# Patient Record
Sex: Male | Born: 1955 | Race: White | Hispanic: No | Marital: Married | Smoking: Current every day smoker
Health system: Southern US, Community
[De-identification: ages and names within clinical notes are randomized; demographics above are authoritative.]

---

## 2016-07-13 DIAGNOSIS — R42 Dizziness and giddiness: Secondary | ICD-10-CM | POA: Diagnosis not present

## 2018-06-01 ENCOUNTER — Other Ambulatory Visit: Payer: Self-pay

## 2018-06-01 ENCOUNTER — Emergency Department (HOSPITAL_COMMUNITY)
Admission: EM | Admit: 2018-06-01 | Discharge: 2018-06-02 | Disposition: A | Discharge status: Home / Self Care | Payer: PPO | Attending: Emergency Medicine | Admitting: Emergency Medicine

## 2018-06-01 DIAGNOSIS — R002 Palpitations: Secondary | ICD-10-CM | POA: Insufficient documentation

## 2018-06-01 NOTE — ED Triage Notes (Signed)
Patient c/o palpitations earlier. Took (3) 81mg  asa PTA. NAD noted at this time

## 2018-06-02 ENCOUNTER — Encounter (HOSPITAL_COMMUNITY): Payer: Self-pay | Admitting: Emergency Medicine

## 2018-06-02 ENCOUNTER — Emergency Department (HOSPITAL_COMMUNITY): Payer: PPO

## 2018-06-02 LAB — BASIC METABOLIC PANEL
Anion gap: 13 (ref 5–15)
BUN: 11 mg/dL (ref 8–23)
CO2: 20 mmol/L — ABNORMAL LOW (ref 22–32)
Calcium: 8.8 mg/dL — ABNORMAL LOW (ref 8.9–10.3)
Chloride: 109 mmol/L (ref 98–111)
Creatinine, Ser: 0.98 mg/dL (ref 0.61–1.24)
GFR calc Af Amer: >60 mL/min (ref >60)
GFR calc non Af Amer: >60 mL/min (ref >60)
Glucose, Bld: 177 mg/dL — ABNORMAL HIGH (ref 70–99)
Potassium: 3.8 mmol/L (ref 3.5–5.1)
Sodium: 142 mmol/L (ref 135–145)

## 2018-06-02 LAB — CBC
HCT: 43 % (ref 39.0–52.0)
Hemoglobin: 14.1 g/dL (ref 13.0–17.0)
MCH: 29.3 pg (ref 26.0–34.0)
MCHC: 32.8 g/dL (ref 30.0–36.0)
MCV: 89.2 fL (ref 80.0–100.0)
Platelets: 271 10*3/uL (ref 150–400)
RBC: 4.82 MIL/uL (ref 4.22–5.81)
RDW: 12.1 % (ref 11.5–15.5)
WBC: 9.8 10*3/uL (ref 4.0–10.5)
nRBC: 0 % (ref 0.0–0.2)

## 2018-06-02 LAB — I-STAT TROPONIN, ED: Troponin i, poc: 0.01 ng/mL (ref 0.00–0.08)

## 2018-06-02 MED ORDER — SODIUM CHLORIDE 0.9% FLUSH: 3.0000 mL | Freq: Once | INTRAVENOUS | Status: DC

## 2018-06-02 NOTE — ED Provider Notes (Signed)
[facility] EMERGENCY DEPARTMENT Provider Note   CSN: [id number] Arrival date & time: 06/01/18  2346     History   Chief Complaint Chief Complaint  Patient presents with  . Palpitations    HPI Anthony Bush is a 63 y.o. male.  Patient presents to the emergency department with a chief complaint of heart palpitations.  States that he had an episode of heart palpitations this evening that lasted for about 15 or 20 minutes.  States that it felt like his heart was racing.  He denies having had any chest pain, shortness of breath, dizziness, or lightheadedness.  States that he has never experienced this before.  States that he had a Coca[employer]a, but states that he routinely drinks tea and several cups of coffee per day, has not had any new or additional stimulants.  Denies having any symptoms at this time.  States that by the time EMS arrived, his symptoms had resolved.  The history is provided by the patient. No language interpreter was used.    No past medical history on file.  There are no active problems to display for this patient.   History reviewed. No pertinent surgical history.      Home Medications    Prior to Admission medications   Not on File    Family History No family history on file.  Social History Social History   Tobacco Use  . Smoking status: Not on file  Substance Use Topics  . Alcohol use: Not on file  . Drug use: Not on file     Allergies   Patient has no known allergies.   Review of Systems Review of Systems  All other systems reviewed and are negative.    Physical Exam Updated Vital Signs BP 108/88 (BP Location: Right Arm)   Pulse (!) 103   Temp 98.8 F (37.1 C)   Resp 16   Ht 5\' 10"  (1.778 m)   Wt 74.8 kg   SpO2 96%   BMI 23.68 kg/m   Physical Exam Vitals signs and nursing note reviewed.  Constitutional:      Appearance: He is well-developed.  HENT:     Head: Normocephalic and atraumatic.  Eyes:   General: No scleral icterus.       Right eye: No discharge.        Left eye: No discharge.     Conjunctiva/sclera: Conjunctivae normal.     Pupils: Pupils are equal, round, and reactive to light.  Neck:     Musculoskeletal: Normal range of motion and neck supple.     Vascular: No JVD.  Cardiovascular:     Rate and Rhythm: Normal rate and regular rhythm.     Heart sounds: Normal heart sounds. No murmur. No friction rub. No gallop.   Pulmonary:     Effort: Pulmonary effort is normal. No respiratory distress.     Breath sounds: Normal breath sounds. No wheezing or rales.  Chest:     Chest wall: No tenderness.  Abdominal:     General: There is no distension.     Palpations: Abdomen is soft. There is no mass.     Tenderness: There is no abdominal tenderness. There is no guarding or rebound.  Musculoskeletal: Normal range of motion.        General: No tenderness.  Skin:    General: Skin is warm and dry.  Neurological:     Mental Status: He is alert and oriented to person, place, and  time.  Psychiatric:        Behavior: Behavior normal.        Thought Content: Thought content normal.        Judgment: Judgment normal.      ED Treatments / Results  Labs (all labs ordered are listed, but only abnormal results are displayed) Labs Reviewed  BASIC METABOLIC PANEL - Abnormal; Notable for the following components:      Result Value   CO2 20 (*)    Glucose, Bld 177 (*)    Calcium 8.8 (*)    All other components within normal limits  CBC  I-STAT TROPONIN, ED    EKG EKG Interpretation  Date/Time:  Sunday June 02 2018 02:50:47 EST Ventricular Rate:  58 PR Interval:    QRS Duration: 89 QT Interval:  413 QTC Calculation: 406 R Axis:   84 Text Interpretation:  Sinus rhythm Borderline right axis deviation Baseline wander in lead(s) V2 When compared with ECG of 06/01/2018, HEART RATE has decreased Nonspecific ST abnormality is no longer present Confirmed by Dione Booze ([id number]) on  06/02/2018 4:41:24 AM  Radiology Dg Chest 2 View  Result Date: 06/02/2018 CLINICAL DATA:  Palpitations EXAM: CHEST - 2 VIEW COMPARISON:  None. FINDINGS: Normal heart size. Normal mediastinal contour. No pneumothorax. No pleural effusion. Mildly hyperinflated lungs. No pulmonary edema. No acute consolidative airspace disease. IMPRESSION: Mildly hyperinflated lungs, suggesting obstructive lung disease. Otherwise no active disease in the chest. Electronically Signed   By: Delbert Phenix M.D.   On: 06/02/2018 00:20    Procedures Procedures (including critical care time)  Medications Ordered in ED Medications  sodium chloride flush (NS) 0.9 % injection 3 mL (has no administration in time range)     Initial Impression / Assessment and Plan / ED Course  I have reviewed the triage vital signs and the nursing notes.  Pertinent labs & imaging results that were available during my care of the patient were reviewed by me and considered in my medical decision making (see chart for details).     Patient with heart palpitations.  Laboratory work-up performed in triage is reassuring.  EKG shows no concerning arrhythmias.  Patient is asymptomatic now.  He is well-appearing.  Will plan for discharge to home with cardiology follow-up.  May benefit from wearing a Holter monitor.  Final Clinical Impressions(s) / ED Diagnoses   Final diagnoses:  Palpitations    ED Discharge Orders    None       Roxy Horseman, PA-C 06/02/18 0443    Dione Booze, MD 06/02/18 [phone]

## 2018-06-05 DIAGNOSIS — R002 Palpitations: Secondary | ICD-10-CM | POA: Diagnosis not present

## 2018-07-19 ENCOUNTER — Telehealth: Payer: Self-pay | Admitting: Cardiology

## 2018-07-19 NOTE — Telephone Encounter (Signed)
Called patient and LVM to call back to reschedule his 07-24-2018 appointment with Dr. [place].  He can be seen on his DOD day on 07-25-2018 if opening available or any other DOD day.

## 2018-07-24 ENCOUNTER — Ambulatory Visit: Payer: Self-pay | Admitting: Cardiology

## 2018-10-10 ENCOUNTER — Telehealth: Payer: Self-pay | Admitting: Cardiology

## 2018-10-10 NOTE — Telephone Encounter (Signed)
LVM for patient to let us know if he wants a video or telephone visit with Dr. Martinique.

## 2018-10-15 ENCOUNTER — Telehealth: Payer: Self-pay | Admitting: Cardiology

## 2018-10-15 NOTE — Telephone Encounter (Signed)
LVM for pre reg /has no email & my chart code exp

## 2018-10-17 ENCOUNTER — Telehealth: Payer: Self-pay | Admitting: Cardiology

## 2018-10-17 NOTE — Telephone Encounter (Addendum)
Called x3 for pre reg & LVM/ no email, my chart code exp

## 2018-10-18 NOTE — Telephone Encounter (Signed)
LVM for pt, reminding him of his appt on 10-21-18 with Dr Jordan °

## 2018-10-18 NOTE — Progress Notes (Deleted)
{  Choose 1 Note Type (Telehealth Visit or Telephone Visit):[phone]}   Date:  10/18/2018   ID:  Anthony Bush, DOB 05/15/1955, MRN [id number]  {Patient [phone]::"Home"} {Provider [phone]::"Home"}  PCP:  Heywood Bene, PA-C  Cardiologist:  Shellene Sweigert Martinique MD Electrophysiologist:  None   Evaluation Performed:  New Patient Evaluation  Chief Complaint:  palpitations  History of Present Illness:    Anthony Bush is a 63 y.o. male seen at the request of Anthony Lundborg PA for evaluation of palpitations. The patient was seen initially in the ED in February with palpitations. Initial Ecg showed tachycardia with rate 103 bpm. Somewhat peaked P waves and ST depression in the inferior leads. Repeat Ecg showed sinus brady at 58 bpm and normal P wave morphology.   The patient {does/does not:200015} have symptoms concerning for COVID-19 infection (fever, chills, cough, or new shortness of breath).    No past medical history on file. No past surgical history on file.   No outpatient medications have been marked as taking for the 10/21/18 encounter (Appointment) with Martinique, [name], MD.     Allergies:   Patient has no known allergies.   Social History   Tobacco Use  . Smoking status: Not on file  Substance Use Topics  . Alcohol use: Not on file  . Drug use: Not on file     Family Hx: The patient's family history is not on file.  ROS:   Please see the history of present illness.    *** All other systems reviewed and are negative.   Prior CV studies:   The following studies were reviewed today:  ***  Labs/Other Tests and Data Reviewed:    EKG:  {EKG/Telemetry Strips Reviewed:[phone]}  Recent Labs: 06/02/2018: BUN 11; Creatinine, Ser 0.98; Hemoglobin 14.1; Platelets 271; Potassium 3.8; Sodium 142   Recent Lipid Panel No results found for: CHOL, TRIG, HDL, CHOLHDL, LDLCALC, LDLDIRECT  Wt Readings from Last 3 Encounters:  06/01/18 165 lb  (74.8 kg)     Objective:    Vital Signs:  There were no vitals taken for this visit.   {[facility] Virtual Exam (Optional):[phone]::"VITAL SIGNS:  reviewed"}  ASSESSMENT & PLAN:    1. Palpitations. ? Ectopic atrial tachycardia.   COVID-19 Education: The signs and symptoms of COVID-19 were discussed with the patient and how to seek care for testing (follow up with PCP or arrange E-visit).  ***The importance of social distancing was discussed today.  Time:   Today, I have spent *** minutes with the patient with telehealth technology discussing the above problems.     Medication Adjustments/Labs and Tests Ordered: Current medicines are reviewed at length with the patient today.  Concerns regarding medicines are outlined above.   Tests Ordered: No orders of the defined types were placed in this encounter.   Medication Changes: No orders of the defined types were placed in this encounter.   Follow Up:  {F/U Format:[phone]} {follow up:15908}  Signed, Kierria Feigenbaum Martinique, MD  10/18/2018 8:24 AM    [name]

## 2018-10-21 ENCOUNTER — Telehealth: Payer: Self-pay

## 2018-10-21 ENCOUNTER — Telehealth: Payer: PPO | Admitting: Cardiology

## 2018-10-21 NOTE — Telephone Encounter (Signed)
Attempted to reach pt 3x in regards to virtual visit with Dr. Martinique scheduled for today but no answer.

## 2019-03-07 DIAGNOSIS — Z20828 Contact with and (suspected) exposure to other viral communicable diseases: Secondary | ICD-10-CM | POA: Diagnosis not present

## 2019-03-07 DIAGNOSIS — J029 Acute pharyngitis, unspecified: Secondary | ICD-10-CM | POA: Diagnosis not present

## 2019-03-07 DIAGNOSIS — J069 Acute upper respiratory infection, unspecified: Secondary | ICD-10-CM | POA: Diagnosis not present

## 2019-08-05 IMAGING — CR DG CHEST 2V
2 series · 2 of 2 positions shown · non-contrast
Comparison: None.

CLINICAL DATA: Palpitations

EXAM:
CHEST - 2 VIEW

[chest pa]
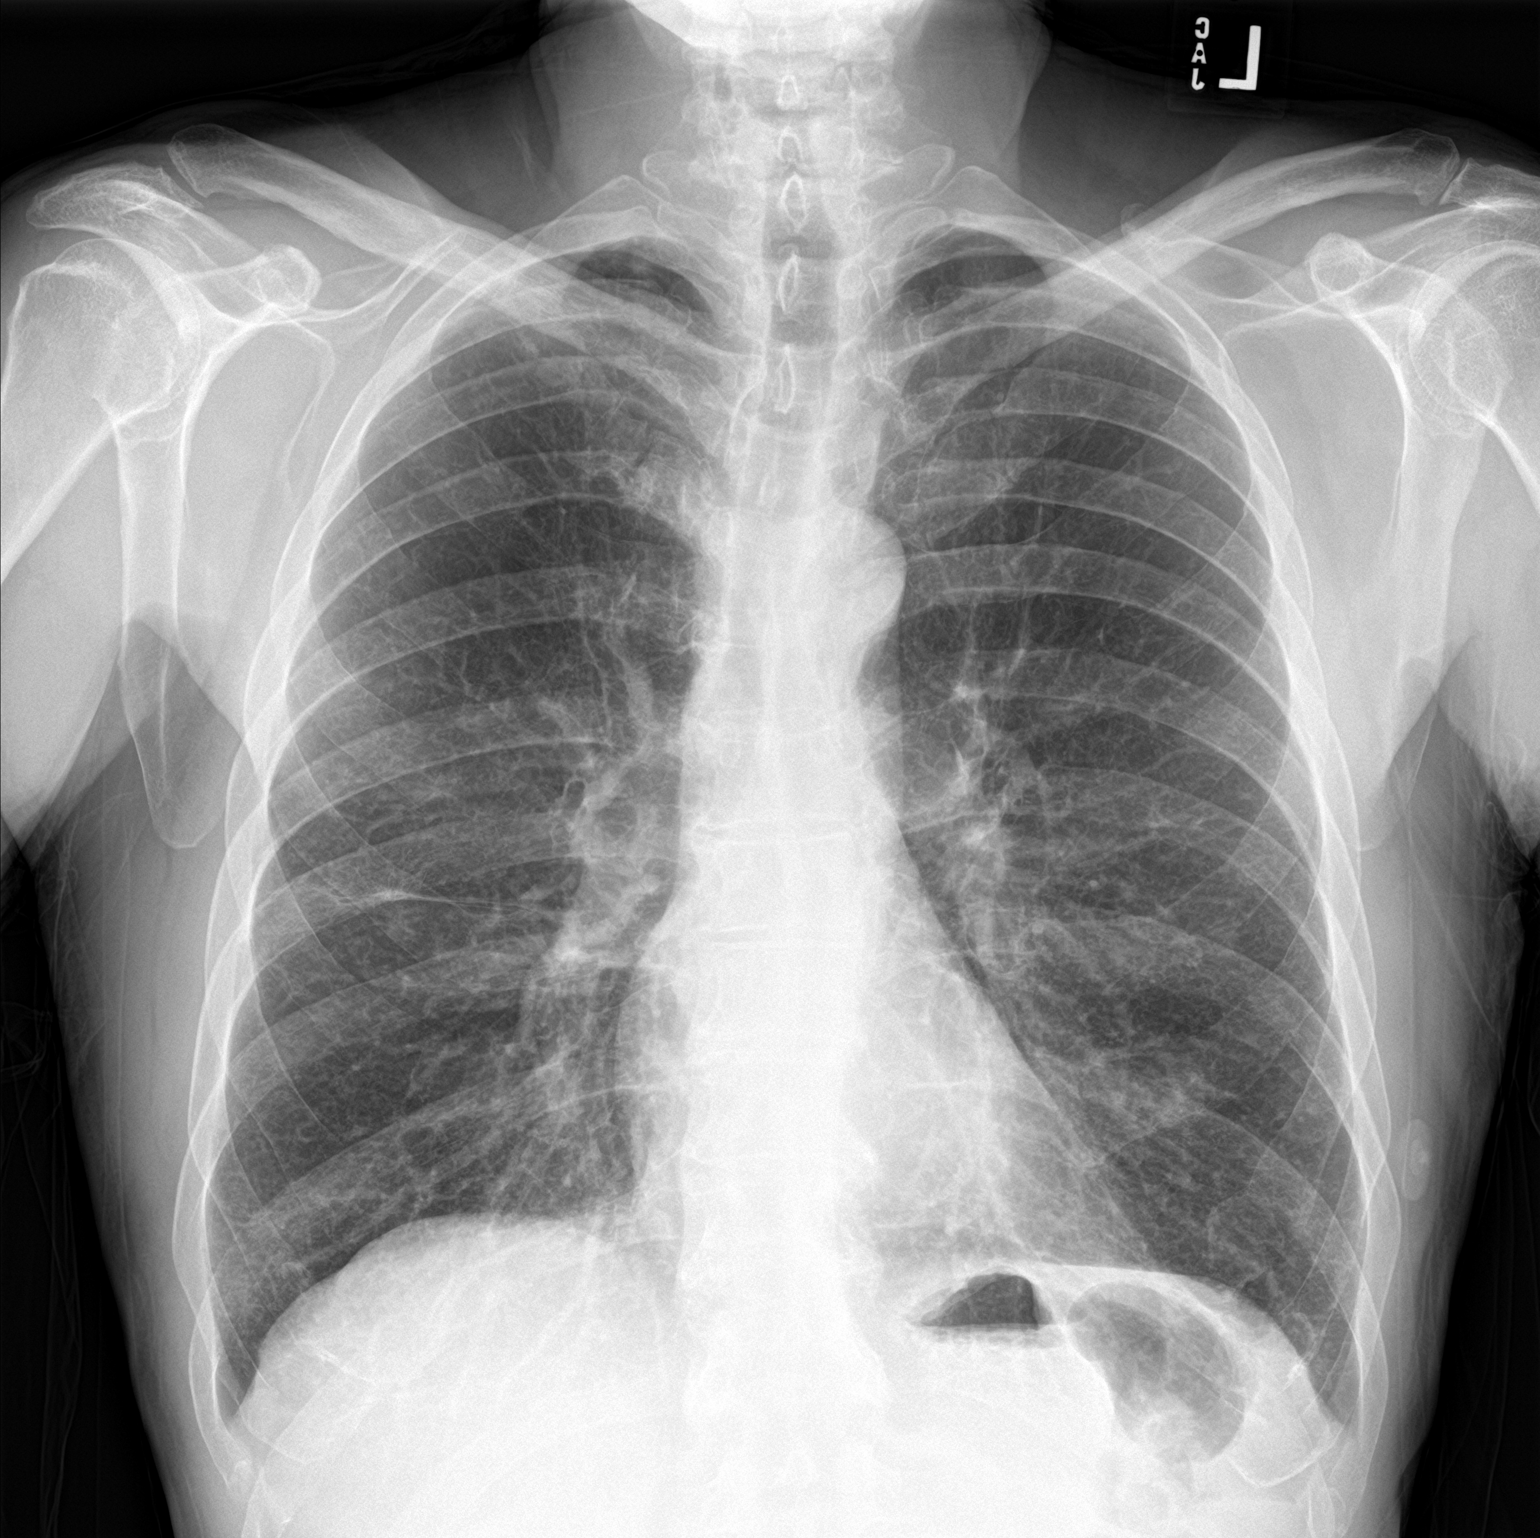

[chest lat]
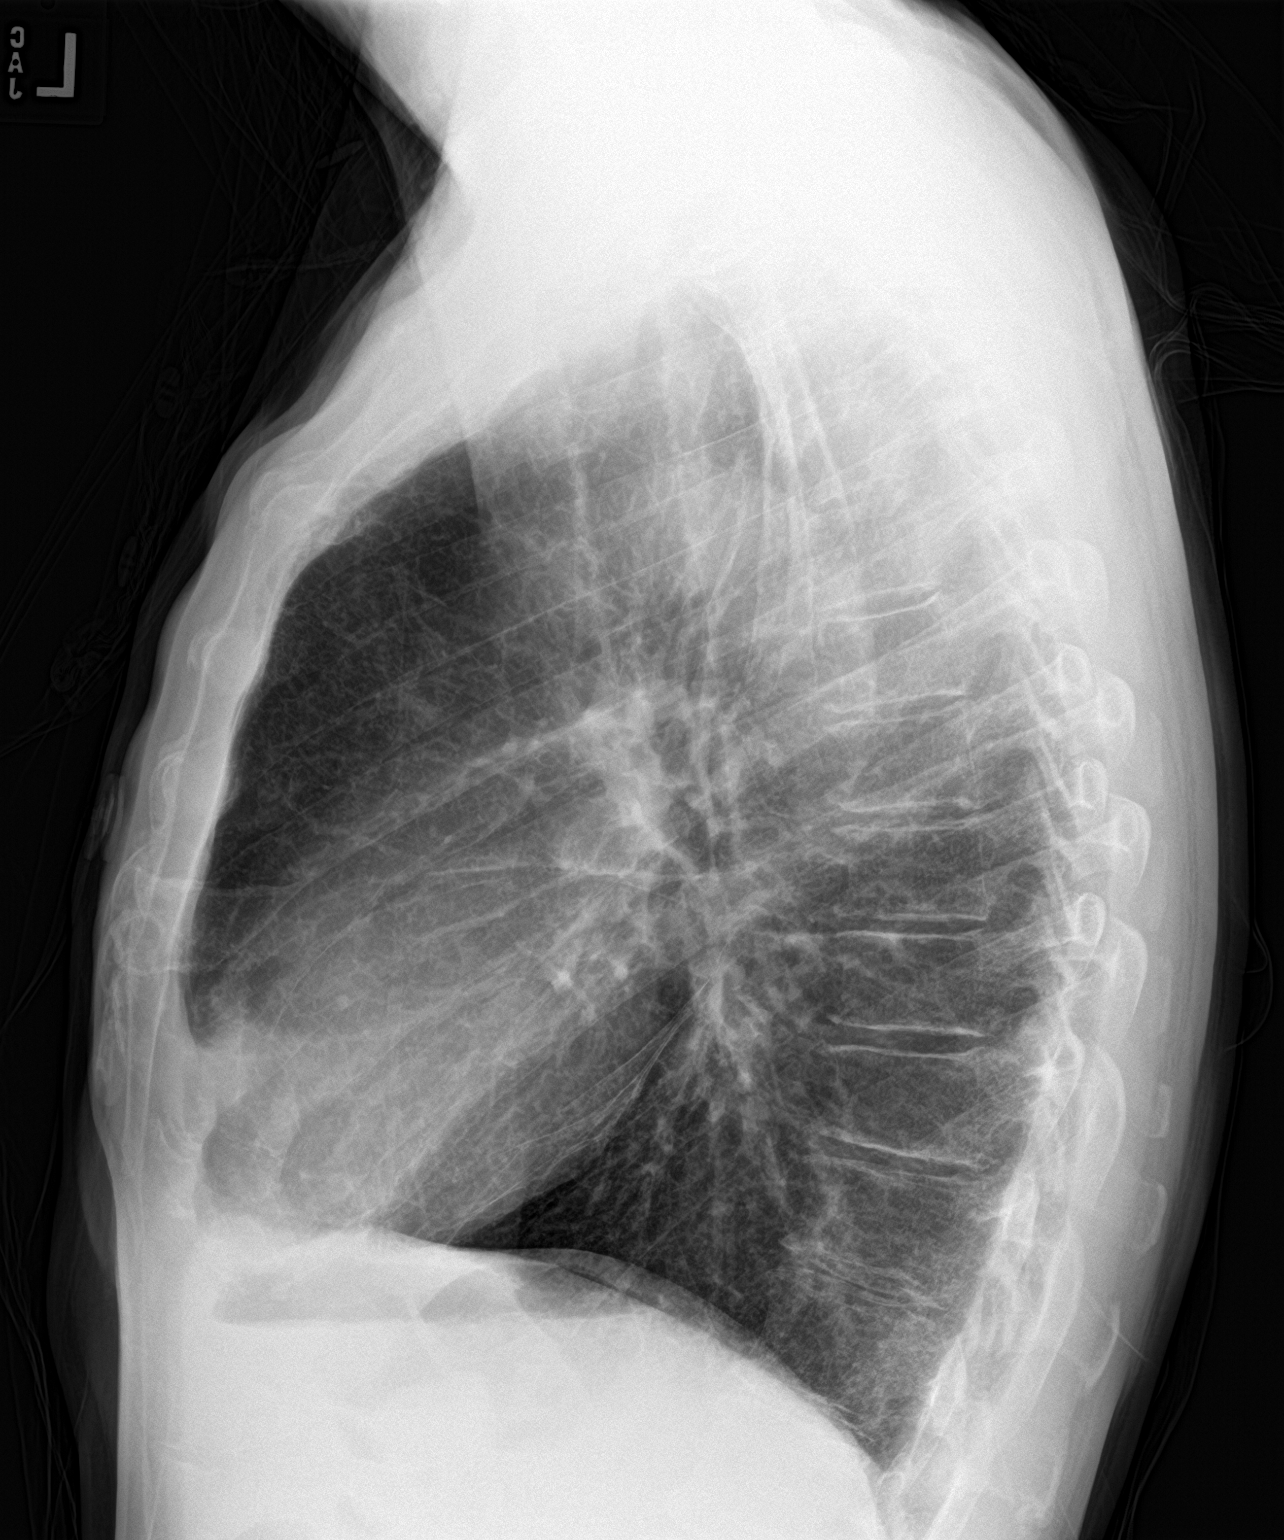

[2 of 2 positions shown; findings below may reference images not displayed]

FINDINGS: Normal heart size. Normal mediastinal contour. No pneumothorax. No
pleural effusion. Mildly hyperinflated lungs. No pulmonary edema. No
acute consolidative airspace disease.
IMPRESSION: Mildly hyperinflated lungs, suggesting obstructive lung disease.
Otherwise no active disease in the chest.

## 2020-02-26 ENCOUNTER — Telehealth (HOSPITAL_COMMUNITY): Payer: Self-pay

## 2020-02-26 ENCOUNTER — Telehealth: Payer: Self-pay | Admitting: Nurse Practitioner

## 2020-02-26 NOTE — Telephone Encounter (Signed)
Pt called to discuss transportation for his wife for remdesivir outpatient appointments. After scheduling the patient's wife's appointments, pt stated he also needed treatment for COVID.   RN discussed with patient about Covid symptoms and the use of the monoclonal antibody infusion for those with mild to moderate Covid symptoms and at a high risk of hospitalization.     Pt appears to qualify for this infusion due to co-morbid conditions and/or a member of an at-risk group in accordance with the FDA Emergency Use Authorization.    Pt stated that his symptoms started on 10/30 and did a home abbot test on 11/3 that was positive. Pt states he is having cough, congestion, body aches, headache, and back pain. Pt qualifies for treatment based on age. Pt informed that an APP will be calling to confirm details with patient.

## 2020-02-26 NOTE — Telephone Encounter (Signed)
Called patient to discuss Covid symptoms and the use of casirivimab/imdevimab, a monoclonal antibody infusion for those with mild to moderate Covid symptoms and at a high risk of hospitalization.  Pt is qualified for this infusion at the [facility] due to; Specific high risk criteria : Older age (>/= 65 yo)   Message left to call back our hotline [phone].  Tarahji Ramthun, NP   

## 2020-02-27 ENCOUNTER — Ambulatory Visit (HOSPITAL_COMMUNITY)
Admission: RE | Admit: 2020-02-27 | Discharge: 2020-02-27 | Disposition: A | Discharge status: Home / Self Care | Payer: PPO | Source: Ambulatory Visit | Attending: Pulmonary Disease | Admitting: Pulmonary Disease

## 2020-02-27 ENCOUNTER — Other Ambulatory Visit: Payer: Self-pay | Admitting: Infectious Diseases

## 2020-02-27 DIAGNOSIS — U071 COVID-19: Secondary | ICD-10-CM

## 2020-02-27 DIAGNOSIS — Z23 Encounter for immunization: Secondary | ICD-10-CM | POA: Diagnosis not present

## 2020-02-27 MED ORDER — FAMOTIDINE IN NACL 20-0.9 MG/50ML-% IV SOLN: 20.0000 mg | Freq: Once | INTRAVENOUS | Status: DC | PRN

## 2020-02-27 MED ORDER — ALBUTEROL SULFATE HFA 108 (90 BASE) MCG/ACT IN AERS: 2.0000 | INHALATION_SPRAY | Freq: Once | RESPIRATORY_TRACT | Status: DC | PRN

## 2020-02-27 MED ORDER — SODIUM CHLORIDE 0.9 % IV SOLN: INTRAVENOUS | Status: DC | PRN

## 2020-02-27 MED ORDER — EPINEPHRINE 0.3 MG/0.3ML IJ SOAJ: 0.3000 mg | Freq: Once | INTRAMUSCULAR | Status: DC | PRN

## 2020-02-27 MED ORDER — DIPHENHYDRAMINE HCL 50 MG/ML IJ SOLN: 50.0000 mg | Freq: Once | INTRAMUSCULAR | Status: DC | PRN

## 2020-02-27 MED ORDER — METHYLPREDNISOLONE SODIUM SUCC 125 MG IJ SOLR: 125.0000 mg | Freq: Once | INTRAMUSCULAR | Status: DC | PRN

## 2020-02-27 MED ORDER — SOTROVIMAB 500 MG/8ML IV SOLN
500.0000 mg | Freq: Once | INTRAVENOUS | Status: AC
  Administered 2020-02-27: 500 mg via INTRAVENOUS

## 2020-02-27 NOTE — Progress Notes (Signed)
°  Diagnosis: COVID-19 ° °Physician:dr wright ° °Procedure: sotrovimab ° °Complications: No immediate complications noted. ° °Discharge: Discharged home  ° °Makenzie Weisner S Urias Sheek °02/27/2020 ° ° °

## 2020-02-27 NOTE — Progress Notes (Signed)
Ward Givens, NP connected by phone with Anthony Bush on 11/04/2021to discuss the potential use of a new treatment for mild to moderate COVID-19 viral infection in non-hospitalized patients.  This patient is a 64 y.o. male that meets the FDA criteria for Emergency Use Authorization of COVID monoclonal antibody casirivimab/imdevimab, bamlanivimab/eteseviamb, or sotrovimab.  Has a (+) direct SARS-CoV-2 viral test result  Has mild or moderate COVID-19   Is NOT hospitalized due to COVID-19  Is within 10 days of symptom onset  Has at least one of the high risk factor(s) for progression to severe COVID-19 and/or hospitalization as defined in EUA.  Specific high risk criteria : Older age (>/= 66 yo)   I have spoken and communicated the following to the patient or parent/caregiver regarding COVID monoclonal antibody treatment:  1. FDA has authorized the emergency use for the treatment of mild to moderate COVID-19 in adults and pediatric patients with positive results of direct SARS-CoV-2 viral testing who are 49 years of age and older weighing at least 40 kg, and who are at high risk for progressing to severe COVID-19 and/or hospitalization.  2. The significant known and potential risks and benefits of COVID monoclonal antibody, and the extent to which such potential risks and benefits are unknown.  3. Information on available alternative treatments and the risks and benefits of those alternatives, including clinical trials.  4. Patients treated with COVID monoclonal antibody should continue to self-isolate and use infection control measures (e.g., wear mask, isolate, social distance, avoid sharing personal items, clean and disinfect "high touch" surfaces, and frequent handwashing) according to CDC guidelines.   5. The patient or parent/caregiver has the option to accept or refuse COVID monoclonal antibody treatment.  After reviewing this information with the patient, the patient has agreed to  receive one of the available covid 19 monoclonal antibodies and will be provided an appropriate fact sheet prior to infusion. Rexene Alberts, NP 02/27/2020 1:37 PM

## 2020-02-27 NOTE — Discharge Instructions (Signed)
COVID-19 COVID-19 is a respiratory infection that is caused by a virus called severe acute respiratory syndrome coronavirus 2 (SARS-CoV-2). The disease is also known as coronavirus disease or novel coronavirus. In some people, the virus may not cause any symptoms. In others, it may cause a serious infection. The infection can get worse quickly and can lead to complications, such as:  Pneumonia, or infection of the lungs.  Acute respiratory distress syndrome or ARDS. This is a condition in which fluid build-up in the lungs prevents the lungs from filling with air and passing oxygen into the blood.  Acute respiratory failure. This is a condition in which there is not enough oxygen passing from the lungs to the body or when carbon dioxide is not passing from the lungs out of the body.  Sepsis or septic shock. This is a serious bodily reaction to an infection.  Blood clotting problems.  Secondary infections due to bacteria or fungus.  Organ failure. This is when your body's organs stop working. The virus that causes COVID-19 is contagious. This means that it can spread from person to person through droplets from coughs and sneezes (respiratory secretions). What are the causes? This illness is caused by a virus. You may catch the virus by:  Breathing in droplets from an infected person. Droplets can be spread by a person breathing, speaking, singing, coughing, or sneezing.  Touching something, like a table or a doorknob, that was exposed to the virus (contaminated) and then touching your mouth, nose, or eyes. What increases the risk? Risk for infection You are more likely to be infected with this virus if you:  Are within 6 feet (2 meters) of a person with COVID-19.  Provide care for or live with a person who is infected with COVID-19.  Spend time in crowded indoor spaces or live in shared housing. Risk for serious illness You are more likely to become seriously ill from the virus if  you:  Are 50 years of age or older. The higher your age, the more you are at risk for serious illness.  Live in a nursing home or long-term care facility.  Have cancer.  Have a long-term (chronic) disease such as: ? Chronic lung disease, including chronic obstructive pulmonary disease or asthma. ? A long-term disease that lowers your body's ability to fight infection (immunocompromised). ? Heart disease, including heart failure, a condition in which the arteries that lead to the heart become narrow or blocked (coronary artery disease), a disease which makes the heart muscle thick, weak, or stiff (cardiomyopathy). ? Diabetes. ? Chronic kidney disease. ? Sickle cell disease, a condition in which red blood cells have an abnormal "sickle" shape. ? Liver disease.  Are obese. What are the signs or symptoms? Symptoms of this condition can range from mild to severe. Symptoms may appear any time from 2 to 14 days after being exposed to the virus. They include:  A fever or chills.  A cough.  Difficulty breathing.  Headaches, body aches, or muscle aches.  Runny or stuffy (congested) nose.  A sore throat.  New loss of taste or smell. Some people may also have stomach problems, such as nausea, vomiting, or diarrhea. Other people may not have any symptoms of COVID-19. How is this diagnosed? This condition may be diagnosed based on:  Your signs and symptoms, especially if: ? You live in an area with a COVID-19 outbreak. ? You recently traveled to or from an area where the virus is common. ? You   provide care for or live with a person who was diagnosed with COVID-19. ? You were exposed to a person who was diagnosed with COVID-19.  A physical exam.  Lab tests, which may include: ? Taking a sample of fluid from the back of your nose and throat (nasopharyngeal fluid), your nose, or your throat using a swab. ? A sample of mucus from your lungs (sputum). ? Blood tests.  Imaging tests,  which may include, X-rays, CT scan, or ultrasound. How is this treated? At present, there is no medicine to treat COVID-19. Medicines that treat other diseases are being used on a trial basis to see if they are effective against COVID-19. Your health care provider will talk with you about ways to treat your symptoms. For most people, the infection is mild and can be managed at home with rest, fluids, and over-the-counter medicines. Treatment for a serious infection usually takes places in a hospital intensive care unit (ICU). It may include one or more of the following treatments. These treatments are given until your symptoms improve.  Receiving fluids and medicines through an IV.  Supplemental oxygen. Extra oxygen is given through a tube in the nose, a face mask, or a hood.  Positioning you to lie on your stomach (prone position). This makes it easier for oxygen to get into the lungs.  Continuous positive airway pressure (CPAP) or bi-level positive airway pressure (BPAP) machine. This treatment uses mild air pressure to keep the airways open. A tube that is connected to a motor delivers oxygen to the body.  Ventilator. This treatment moves air into and out of the lungs by using a tube that is placed in your windpipe.  Tracheostomy. This is a procedure to create a hole in the neck so that a breathing tube can be inserted.  Extracorporeal membrane oxygenation (ECMO). This procedure gives the lungs a chance to recover by taking over the functions of the heart and lungs. It supplies oxygen to the body and removes carbon dioxide. Follow these instructions at home: Lifestyle  If you are sick, stay home except to get medical care. Your health care provider will tell you how long to stay home. Call your health care provider before you go for medical care.  Rest at home as told by your health care provider.  Do not use any products that contain nicotine or tobacco, such as cigarettes,  e-cigarettes, and chewing tobacco. If you need help quitting, ask your health care provider.  Return to your normal activities as told by your health care provider. Ask your health care provider what activities are safe for you. General instructions  Take over-the-counter and prescription medicines only as told by your health care provider.  Drink enough fluid to keep your urine pale yellow.  Keep all follow-up visits as told by your health care provider. This is important. How is this prevented?  There is no vaccine to help prevent COVID-19 infection. However, there are steps you can take to protect yourself and others from this virus. To protect yourself:   Do not travel to areas where COVID-19 is a risk. The areas where COVID-19 is reported change often. To identify high-risk areas and travel restrictions, check the CDC travel website: wwwnc.cdc.gov/travel/notices  If you live in, or must travel to, an area where COVID-19 is a risk, take precautions to avoid infection. ? Stay away from people who are sick. ? Wash your hands often with soap and water for 20 seconds. If soap and water   are not available, use an alcohol-based hand sanitizer. ? Avoid touching your mouth, face, eyes, or nose. ? Avoid going out in public, follow guidance from your state and local health authorities. ? If you must go out in public, wear a cloth face covering or face mask. Make sure your mask covers your nose and mouth. ? Avoid crowded indoor spaces. Stay at least 6 feet (2 meters) away from others. ? Disinfect objects and surfaces that are frequently touched every day. This may include:  Counters and tables.  Doorknobs and light switches.  Sinks and faucets.  Electronics, such as phones, remote controls, keyboards, computers, and tablets. To protect others: If you have symptoms of COVID-19, take steps to prevent the virus from spreading to others.  If you think you have a COVID-19 infection, contact  your health care provider right away. Tell your health care team that you think you may have a COVID-19 infection.  Stay home. Leave your house only to seek medical care. Do not use public transport.  Do not travel while you are sick.  Wash your hands often with soap and water for 20 seconds. If soap and water are not available, use alcohol-based hand sanitizer.  Stay away from other members of your household. Let healthy household members care for children and pets, if possible. If you have to care for children or pets, wash your hands often and wear a mask. If possible, stay in your own room, separate from others. Use a different bathroom.  Make sure that all people in your household wash their hands well and often.  Cough or sneeze into a tissue or your sleeve or elbow. Do not cough or sneeze into your hand or into the air.  Wear a cloth face covering or face mask. Make sure your mask covers your nose and mouth. Where to find more information  Centers for Disease Control and Prevention: www.cdc.gov/coronavirus/2019-ncov/index.html  World Health Organization: www.who.int/health-topics/coronavirus Contact a health care provider if:  You live in or have traveled to an area where COVID-19 is a risk and you have symptoms of the infection.  You have had contact with someone who has COVID-19 and you have symptoms of the infection. Get help right away if:  You have trouble breathing.  You have pain or pressure in your chest.  You have confusion.  You have bluish lips and fingernails.  You have difficulty waking from sleep.  You have symptoms that get worse. These symptoms may represent a serious problem that is an emergency. Do not wait to see if the symptoms will go away. Get medical help right away. Call your local emergency services (911 in the [place] Do not drive yourself to the hospital. Let the emergency medical personnel know if you think you have  COVID-19. Summary  COVID-19 is a respiratory infection that is caused by a virus. It is also known as coronavirus disease or novel coronavirus. It can cause serious infections, such as pneumonia, acute respiratory distress syndrome, acute respiratory failure, or sepsis.  The virus that causes COVID-19 is contagious. This means that it can spread from person to person through droplets from breathing, speaking, singing, coughing, or sneezing.  You are more likely to develop a serious illness if you are 50 years of age or older, have a weak immune system, live in a nursing home, or have chronic disease.  There is no medicine to treat COVID-19. Your health care provider will talk with you about ways to treat your symptoms.    Take steps to protect yourself and others from infection. Wash your hands often and disinfect objects and surfaces that are frequently touched every day. Stay away from people who are sick and wear a mask if you are sick. This information is not intended to replace advice given to you by your health care provider. Make sure you discuss any questions you have with your health care provider. Document Revised: 02/07/2019 Document Reviewed: 05/16/2018 Elsevier Patient Education  2020 Elsevier Inc. What types of side effects do monoclonal antibody drugs cause?  Common side effects  In general, the more common side effects caused by monoclonal antibody drugs include: . Allergic reactions, such as hives or itching . Flu-like signs and symptoms, including chills, fatigue, fever, and muscle aches and pains . Nausea, vomiting . Diarrhea . Skin rashes . Low blood pressure   The CDC is recommending patients who receive monoclonal antibody treatments wait at least 90 days before being vaccinated.  Currently, there are no data on the safety and efficacy of mRNA COVID-19 vaccines in persons who received monoclonal antibodies or convalescent plasma as part of COVID-19 treatment. Based  on the estimated half-life of such therapies as well as evidence suggesting that reinfection is uncommon in the 90 days after initial infection, vaccination should be deferred for at least 90 days, as a precautionary measure until additional information becomes available, to avoid interference of the antibody treatment with vaccine-induced immune responses. 

## 2020-10-12 DIAGNOSIS — R21 Rash and other nonspecific skin eruption: Secondary | ICD-10-CM | POA: Diagnosis not present

## 2020-10-12 DIAGNOSIS — S80261A Insect bite (nonvenomous), right knee, initial encounter: Secondary | ICD-10-CM | POA: Diagnosis not present

## 2020-10-12 DIAGNOSIS — S70361A Insect bite (nonvenomous), right thigh, initial encounter: Secondary | ICD-10-CM | POA: Diagnosis not present

## 2020-10-12 DIAGNOSIS — W57XXXA Bitten or stung by nonvenomous insect and other nonvenomous arthropods, initial encounter: Secondary | ICD-10-CM | POA: Diagnosis not present

## 2020-10-19 DIAGNOSIS — Z136 Encounter for screening for cardiovascular disorders: Secondary | ICD-10-CM | POA: Diagnosis not present

## 2020-10-19 DIAGNOSIS — Z0000 Encounter for general adult medical examination without abnormal findings: Secondary | ICD-10-CM | POA: Diagnosis not present

## 2020-10-19 DIAGNOSIS — Z125 Encounter for screening for malignant neoplasm of prostate: Secondary | ICD-10-CM | POA: Diagnosis not present

## 2020-10-19 DIAGNOSIS — Z13220 Encounter for screening for lipoid disorders: Secondary | ICD-10-CM | POA: Diagnosis not present

## 2020-10-19 DIAGNOSIS — Z8616 Personal history of COVID-19: Secondary | ICD-10-CM | POA: Diagnosis not present

## 2020-10-19 DIAGNOSIS — R634 Abnormal weight loss: Secondary | ICD-10-CM | POA: Diagnosis not present

## 2020-10-19 DIAGNOSIS — Z1159 Encounter for screening for other viral diseases: Secondary | ICD-10-CM | POA: Diagnosis not present

## 2023-02-01 ENCOUNTER — Encounter (HOSPITAL_COMMUNITY): Payer: Self-pay | Admitting: Emergency Medicine

## 2023-02-01 ENCOUNTER — Emergency Department (HOSPITAL_COMMUNITY): Payer: PPO

## 2023-02-01 ENCOUNTER — Other Ambulatory Visit: Payer: Self-pay

## 2023-02-01 ENCOUNTER — Emergency Department (HOSPITAL_COMMUNITY)
Admission: EM | Admit: 2023-02-01 | Discharge: 2023-02-01 | Disposition: A | Discharge status: Home / Self Care | Payer: PPO | Attending: Emergency Medicine | Admitting: Emergency Medicine

## 2023-02-01 DIAGNOSIS — R7309 Other abnormal glucose: Secondary | ICD-10-CM | POA: Insufficient documentation

## 2023-02-01 DIAGNOSIS — F439 Reaction to severe stress, unspecified: Secondary | ICD-10-CM | POA: Insufficient documentation

## 2023-02-01 DIAGNOSIS — Z7982 Long term (current) use of aspirin: Secondary | ICD-10-CM | POA: Insufficient documentation

## 2023-02-01 DIAGNOSIS — I4891 Unspecified atrial fibrillation: Secondary | ICD-10-CM | POA: Insufficient documentation

## 2023-02-01 DIAGNOSIS — R000 Tachycardia, unspecified: Secondary | ICD-10-CM | POA: Diagnosis present

## 2023-02-01 LAB — BASIC METABOLIC PANEL
Anion gap: 8 (ref 5–15)
BUN: 15 mg/dL (ref 8–23)
CO2: 24 mmol/L (ref 22–32)
Calcium: 8.9 mg/dL (ref 8.9–10.3)
Chloride: 106 mmol/L (ref 98–111)
Creatinine, Ser: 1 mg/dL (ref 0.61–1.24)
GFR, Estimated: >60 mL/min (ref >60)
Glucose, Bld: 160 mg/dL — ABNORMAL HIGH (ref 70–99)
Potassium: 4 mmol/L (ref 3.5–5.1)
Sodium: 138 mmol/L (ref 135–145)

## 2023-02-01 LAB — CBC
HCT: 41.6 % (ref 39.0–52.0)
Hemoglobin: 14.4 g/dL (ref 13.0–17.0)
MCH: 31 pg (ref 26.0–34.0)
MCHC: 34.6 g/dL (ref 30.0–36.0)
MCV: 89.5 fL (ref 80.0–100.0)
Platelets: 285 10*3/uL (ref 150–400)
RBC: 4.65 MIL/uL (ref 4.22–5.81)
RDW: 12.5 % (ref 11.5–15.5)
WBC: 9.4 10*3/uL (ref 4.0–10.5)
nRBC: 0 % (ref 0.0–0.2)

## 2023-02-01 LAB — MAGNESIUM: Magnesium: 2 mg/dL (ref 1.7–2.4)

## 2023-02-01 LAB — PROTIME-INR
INR: 0.9 (ref 0.8–1.2)
Prothrombin Time: 12.8 s (ref 11.4–15.2)

## 2023-02-01 MED ORDER — METOPROLOL TARTRATE 5 MG/5ML IV SOLN
5.0000 mg | Freq: Once | INTRAVENOUS | Status: AC
  Administered 2023-02-01: 5 mg via INTRAVENOUS
  Filled 2023-02-01: qty 5

## 2023-02-01 MED ORDER — METOPROLOL SUCCINATE ER 25 MG PO TB24: 25.0000 mg | ORAL_TABLET | Freq: Every day | ORAL | 0 refills | Status: DC

## 2023-02-01 NOTE — Discharge Instructions (Addendum)
You were seen in the emergency department for palpitations dizziness.  You were in atrial fibrillation.  Your heart rate was slowed down with some medications and we are sending you home with a prescription for this medicine.  The atrial fibrillation clinic should call you for follow-up.  Return to the emergency department if any fainting spells chest pain or other concerning symptoms.

## 2023-02-01 NOTE — ED Triage Notes (Addendum)
Pt BIB [facility] for irregular heartbeat and dizziness, pt in A-Fib on monitor with no hx, v/s en route 160/91, 99% RA, HR 130-140's, pt reports taking (4) aspirin PTA

## 2023-02-01 NOTE — ED Provider Notes (Signed)
[facility] Provider Note   CSN: [id number] Arrival date & time: 02/01/23  2024     History  Chief Complaint  Patient presents with   Atrial Fibrillation    Anthony Bush is a 67 y.o. male.  He has no significant past medical history.  He said around 31 tonight he acutely felt his heart start racing.  It was associated with a little bit of light headedness and some tightness around his neck.  He took 4 baby aspirin and called the ambulance who brought him here.  He said he had a similar episode of this about 4 years ago that resolved on its own.  He has been under a lot of stress taking care of his mother who has dementia.  He does drink a lot of caffeine as he works second shift.  Denies any drugs.  Does not routinely take any medications.  Currently denies that his heart feels like it is racing and he feels back to baseline  The history is provided by the patient.  Atrial Fibrillation This is a new problem. The current episode started 1 to 2 hours ago. The problem has been resolved. Pertinent negatives include no chest pain, no abdominal pain, no headaches and no shortness of breath. Nothing aggravates the symptoms. Nothing relieves the symptoms. He has tried ASA for the symptoms. The treatment provided significant relief.       Home Medications Prior to Admission medications   Medication Sig Start Date End Date Taking? Authorizing Provider  aspirin EC 81 MG tablet Take 81 mg by mouth daily. 06/05/18  Yes [provider]  lactose free nutrition (BOOST) LIQD Take 237 mLs by mouth daily.   Yes [provider]      Allergies    Bee pollen    Review of Systems   Review of Systems  Constitutional:  Negative for fever.  Eyes:  Negative for visual disturbance.  Respiratory:  Negative for shortness of breath.   Cardiovascular:  Positive for palpitations. Negative for chest pain.  Gastrointestinal:  Negative for  abdominal pain.  Neurological:  Positive for dizziness. Negative for headaches.    Physical Exam Updated Vital Signs BP (!) 124/109   Pulse (!) 127   Resp (!) 23   Ht 5\' 10"  (1.778 m)   Wt 64 kg   SpO2 99%   BMI 20.23 kg/m  Physical Exam Vitals and nursing note reviewed.  Constitutional:      General: He is not in acute distress.    Appearance: Normal appearance. He is well-developed.  HENT:     Head: Normocephalic and atraumatic.  Eyes:     Conjunctiva/sclera: Conjunctivae normal.  Cardiovascular:     Rate and Rhythm: Tachycardia present. Rhythm irregular.     Heart sounds: No murmur heard. Pulmonary:     Effort: Pulmonary effort is normal. No respiratory distress.     Breath sounds: Normal breath sounds.  Abdominal:     Palpations: Abdomen is soft.     Tenderness: There is no abdominal tenderness. There is no guarding or rebound.  Musculoskeletal:        General: No swelling.     Cervical back: Neck supple.  Skin:    General: Skin is warm and dry.     Capillary Refill: Capillary refill takes less than 2 seconds.  Neurological:     General: No focal deficit present.     Mental Status: He is alert.  Sensory: No sensory deficit.     Motor: No weakness.     ED Results / Procedures / Treatments   Labs (all labs ordered are listed, but only abnormal results are displayed) Labs Reviewed  BASIC METABOLIC PANEL - Abnormal; Notable for the following components:      Result Value   Glucose, Bld 160 (*)    All other components within normal limits  CBC  PROTIME-INR  MAGNESIUM  TSH    EKG EKG Interpretation Date/Time:  Thursday February 01 2023 20:37:01 EDT Ventricular Rate:  133 PR Interval:    QRS Duration:  88 QT Interval:  305 QTC Calculation: 454 R Axis:   87  Text Interpretation: Atrial fibrillation Consider right ventricular hypertrophy Baseline wander in lead(s) V1 afib with RVR new from prior 2/20 Confirmed by Meridee Score [phone]) on 02/01/2023  9:10:30 PM  Radiology DG Chest Port 1 View  Result Date: 02/01/2023 CLINICAL DATA:  Atrial fibrillation, irregular heartbeat, and dizziness. EXAM: PORTABLE CHEST 1 VIEW COMPARISON:  06/02/2018. FINDINGS: The heart size and mediastinal contours are within normal limits. Atherosclerotic calcification of the aorta is noted. Hyperinflation of the lungs is noted. No consolidation, effusion, or pneumothorax. Stable scarring is noted in the mid right lung. No acute osseous abnormality. IMPRESSION: Hyperinflation of the lungs with no acute process. Electronically Signed   By: Thornell Sartorius M.D.   On: 02/01/2023 21:37    Procedures .Critical Care  Performed by: Terrilee Files, MD Authorized by: Terrilee Files, MD   Critical care provider statement:    Critical care time (minutes):  45   Critical care time was exclusive of:  Separately billable procedures and treating other patients   Critical care was necessary to treat or prevent imminent or life-threatening deterioration of the following conditions:  Circulatory failure   Critical care was time spent personally by me on the following activities:  Development of treatment plan with patient or surrogate, discussions with consultants, evaluation of patient's response to treatment, examination of patient, obtaining history from patient or surrogate, ordering and performing treatments and interventions, ordering and review of laboratory studies, ordering and review of radiographic studies, pulse oximetry, re-evaluation of patient's condition and review of old charts   I assumed direction of critical care for this patient from another provider in my specialty: no       Medications Ordered in ED Medications  metoprolol tartrate (LOPRESSOR) injection 5 mg (5 mg Intravenous Given 02/01/23 2136)  metoprolol tartrate (LOPRESSOR) injection 5 mg (5 mg Intravenous Given 02/01/23 2215)    ED Course/ Medical Decision Making/ A&P Clinical Course as of  02/02/23 1007  Thu Feb 01, 2023  2125 Chest x-ray interpreted by me as no clear infiltrate.  Awaiting radiology reading. [MB]  2212 Patient's CHA2DS2-VASc score is 1.  No indications for anticoagulation at this time. [MB]  2251 Discussed with cardiology at [facility] Dr. Brayton Layman.  He said if he can ambulate in the department and does not feel dizzy he would be appropriate for outpatient treatment.  Recommended 25 mg Toprol-XL and follow-up with A-fib clinic.  No need for anticoagulation with his low CHA2DS2-VASc. [MB]    Clinical Course User Index [MB] Terrilee Files, MD                                 Medical Decision Making Amount and/or Complexity of Data Reviewed Labs: ordered. Radiology: ordered.  Risk  Prescription drug management.   This patient complains of palpitations dizziness; this involves an extensive number of treatment Options and is a complaint that carries with it a high risk of complications and morbidity. The differential includes arrhythmia, dehydration, metabolic derangement, ACS  I ordered, reviewed and interpreted labs, which included CBC with normal white count, chemistries with elevated glucose, TSH normal, magnesium normal I ordered medication IV beta-blocker for patient's tachycardia and reviewed PMP when indicated. I ordered imaging studies which included chest x-ray and I independently    visualized and interpreted imaging which showed hyperinflation Additional history obtained from EMS Previous records obtained and reviewed in epic no recent admissions I consulted cardiology Dr. Lily Peer and discussed lab and imaging findings and discussed disposition.  Cardiac monitoring reviewed, atrial fibrillation with RVR improving to atrial fibrillation with controlled rate Social determinants considered, tobacco use Critical Interventions: IV medications for rate control and discussion with cardiology  After the interventions stated above, I reevaluated the  patient and found patient to be asymptomatic and heart rates closer to 100 Admission and further testing considered, patient feels back to baseline and does not feel he needs admission to the hospital.  I think this is reasonable and his rate is improved with beta-blocker.  Per cardiology recommendations will start on metoprolol and put in referral to A-fib clinic.  CHA2DS2-VASc is low and does not need anticoagulation at this time.  Return instructions discussed         Final Clinical Impression(s) / ED Diagnoses Final diagnoses:  New onset atrial fibrillation [facility])    Rx / DC Orders ED Discharge Orders          Ordered    Amb Referral to AFIB Clinic        02/01/23 2313    metoprolol succinate (TOPROL-XL) 25 MG 24 hr tablet  Daily        02/01/23 2313              Terrilee Files, MD 02/02/23 1010

## 2023-02-02 LAB — TSH: TSH: 0.744 u[IU]/mL (ref 0.350–4.500)

## 2023-02-27 ENCOUNTER — Ambulatory Visit (HOSPITAL_COMMUNITY): Payer: PPO | Admitting: Internal Medicine

## 2023-03-08 ENCOUNTER — Ambulatory Visit (HOSPITAL_COMMUNITY)
Admission: RE | Admit: 2023-03-08 | Discharge: 2023-03-08 | Disposition: A | Discharge status: Home / Self Care | Payer: PPO | Source: Ambulatory Visit | Attending: Internal Medicine | Admitting: Internal Medicine

## 2023-03-08 ENCOUNTER — Encounter (HOSPITAL_COMMUNITY): Payer: Self-pay | Admitting: Internal Medicine

## 2023-03-08 VITALS — BP 126/60 | HR 57 | Ht 70.0 in | Wt 146.8 lb

## 2023-03-08 DIAGNOSIS — I4891 Unspecified atrial fibrillation: Secondary | ICD-10-CM | POA: Diagnosis not present

## 2023-03-08 DIAGNOSIS — I480 Paroxysmal atrial fibrillation: Secondary | ICD-10-CM | POA: Diagnosis present

## 2023-03-08 MED ORDER — METOPROLOL SUCCINATE ER 25 MG PO TB24: 25.0000 mg | ORAL_TABLET | Freq: Every day | ORAL | 1 refills | Status: AC | PRN

## 2023-03-08 NOTE — Progress Notes (Signed)
Primary Care Physician: Roger Kill, PA-C Primary Cardiologist: None Electrophysiologist: None     Referring Physician: ED at [facility] Anthony Bush is a 67 y.o. male with a history of paroxysmal atrial fibrillation who presents for consultation in the [facility]. ED visit on 02/01/23 for palpitations found to be in new onset Afib with RVR. Labs were normal. Discharged on Toprol 25 mg daily; did not require cardioversion. Patient is not on anticoagulation. He has a CHADS2VASC score of 1.  On evaluation today, he is currently in NSR. He notes lots of extra stress this year due to custody / caring of grandson, taking care of mother with dementia, and caring for wife with post-Covid symptoms. He notes to work second shift and drinks lots of caffeine. He has cut back on his coffee intake.He occasionally has alcohol. He took Toprol for 2 days and stopped due to feeling dizzy and tired.  Today, he denies symptoms of chest pain, shortness of breath, orthopnea, PND, lower extremity edema, dizziness, presyncope, syncope, snoring, daytime somnolence, bleeding, or neurologic sequela. The patient is tolerating medications without difficulties and is otherwise without complaint today.    he has a BMI of Body mass index is 21.06 kg/m.Marland Kitchen Filed Weights   03/08/23 0915  Weight: 66.6 kg    Current Outpatient Medications  Medication Sig Dispense Refill   aspirin EC 81 MG tablet Take 81 mg by mouth daily.     lactose free nutrition (BOOST) LIQD Take 237 mLs by mouth daily.     metoprolol succinate (TOPROL-XL) 25 MG 24 hr tablet Take 1 tablet (25 mg total) by mouth daily as needed (breakthrough afib). 30 tablet 1   No current facility-administered medications for this encounter.    Atrial Fibrillation Management history:  Previous antiarrhythmic drugs: none Previous cardioversions: none Previous ablations: none Anticoagulation history: none   ROS-  All systems are reviewed and negative except as per the HPI above.  Physical Exam: BP 126/60   Pulse (!) 57   Ht 5\' 10"  (1.778 m)   Wt 66.6 kg   BMI 21.06 kg/m   GEN: Well nourished, well developed in no acute distress NECK: No JVD; No carotid bruits CARDIAC: Regular rate and rhythm, no murmurs, rubs, gallops RESPIRATORY:  Clear to auscultation without rales, wheezing or rhonchi  ABDOMEN: Soft, non-tender, non-distended EXTREMITIES:  No edema; No deformity   EKG today demonstrates  Vent. rate 57 BPM PR interval 142 ms QRS duration 92 ms QT/QTcB 414/402 ms P-R-T axes 81 78 77 Sinus bradycardia Incomplete right bundle branch block Borderline ECG When compared with ECG of 01-Feb-2023 22:42, PREVIOUS ECG IS PRESENT  Echo N/A  ASSESSMENT & PLAN CHA2DS2-VASc Score = 1  The patient's score is based upon: CHF History: 0 HTN History: 0 Diabetes History: 0 Stroke History: 0 Vascular Disease History: 0 Age Score: 1 Gender Score: 0       ASSESSMENT AND PLAN: Paroxysmal Atrial Fibrillation (ICD10:  I48.0) The patient's CHA2DS2-VASc score is 1, indicating a 0.6% annual risk of stroke.    He is currently in NSR.  Education provided about Afib. After discussion, we will proceed with conservative observation at this time. Rhythm monitoring device recommended. He declines anticoagulation at this time. Will order baseline echocardiogram. Follow up in 6 months to reassess. He is not going to take Toprol but will take 1/2 tablet PRN palpitations.    Follow up 6 months Afib clinic.  Lake Bells, PA-C  Afib Clinic [facility] [address] [place], [place] [address] [phone]

## 2023-04-20 ENCOUNTER — Ambulatory Visit (HOSPITAL_COMMUNITY): Admission: RE | Admit: 2023-04-20 | Payer: PPO | Source: Ambulatory Visit

## 2023-09-05 ENCOUNTER — Encounter (HOSPITAL_COMMUNITY): Payer: Self-pay

## 2023-09-05 ENCOUNTER — Ambulatory Visit (HOSPITAL_COMMUNITY): Payer: PPO | Attending: Internal Medicine | Admitting: Internal Medicine

## 2023-09-05 NOTE — Progress Notes (Incomplete)
    Primary Care Physician: Lory Rough., PA-C Primary Cardiologist: None Electrophysiologist: None     Referring Physician: ED at [facility] Anthony Bush is a 68 y.o. male with a history of paroxysmal atrial fibrillation who presents for consultation in the [facility]. ED visit on 02/01/23 for palpitations found to be in new onset Afib with RVR. Labs were normal. Discharged on Toprol  25 mg daily; did not require cardioversion. Patient is not on anticoagulation. He has a CHADS2VASC score of 1.  On evaluation today, he is currently in NSR. He notes lots of extra stress this year due to custody / caring of grandson, taking care of mother with dementia, and caring for wife with post-Covid symptoms. He notes to work second shift and drinks lots of caffeine. He has cut back on his coffee intake.He occasionally has alcohol. He took Toprol  for 2 days and stopped due to feeling dizzy and tired.  On follow up 09/05/23, he is currently in ***. He has had *** episodes of Afib since last office visit. He is not on anticoagulation due to low risk score. He did not undergo ordered echocardiogram imaging.  Today, he denies symptoms of chest pain, shortness of breath, orthopnea, PND, lower extremity edema, dizziness, presyncope, syncope, snoring, daytime somnolence, bleeding, or neurologic sequela. The patient is tolerating medications without difficulties and is otherwise without complaint today.    he has a BMI of There is no height or weight on file to calculate BMI.. There were no vitals filed for this visit.   Current Outpatient Medications  Medication Sig Dispense Refill   aspirin EC 81 MG tablet Take 81 mg by mouth daily.     lactose free nutrition (BOOST) LIQD Take 237 mLs by mouth daily.     metoprolol  succinate (TOPROL -XL) 25 MG 24 hr tablet Take 1 tablet (25 mg total) by mouth daily as needed (breakthrough afib). 30 tablet 1   No current  facility-administered medications for this visit.    Atrial Fibrillation Management history:  Previous antiarrhythmic drugs: none Previous cardioversions: none Previous ablations: none Anticoagulation history: none   ROS- All systems are reviewed and negative except as per the HPI above.  Physical Exam: There were no vitals taken for this visit.  GEN- The patient is well appearing, alert and oriented x 3 today.   Neck - no JVD or carotid bruit noted Lungs- Clear to ausculation bilaterally, normal work of breathing Heart- ***Regular rate and rhythm, no murmurs, rubs or gallops, PMI not laterally displaced Extremities- no clubbing, cyanosis, or edema Skin - no rash or ecchymosis noted   EKG today demonstrates  ***  Echo N/A  ASSESSMENT & PLAN CHA2DS2-VASc Score = 1  The patient's score is based upon: CHF History: 0 HTN History: 0 Diabetes History: 0 Stroke History: 0 Vascular Disease History: 0 Age Score: 1 Gender Score: 0       ASSESSMENT AND PLAN: Paroxysmal Atrial Fibrillation (ICD10:  I48.0) The patient's CHA2DS2-VASc score is 1, indicating a 0.6% annual risk of stroke.    He is currently in ***.   Follow up ***.    Minnie Amber, PA-C  Afib Clinic [facility] [address] [place], [place] [address] [phone]

## 2023-12-15 ENCOUNTER — Emergency Department (HOSPITAL_BASED_OUTPATIENT_CLINIC_OR_DEPARTMENT_OTHER)
Admission: EM | Admit: 2023-12-15 | Discharge: 2023-12-15 | Disposition: A | Discharge status: Home / Self Care | Attending: Emergency Medicine | Admitting: Emergency Medicine

## 2023-12-15 ENCOUNTER — Other Ambulatory Visit: Payer: Self-pay

## 2023-12-15 DIAGNOSIS — L089 Local infection of the skin and subcutaneous tissue, unspecified: Secondary | ICD-10-CM

## 2023-12-15 DIAGNOSIS — L0502 Pilonidal sinus with abscess: Secondary | ICD-10-CM | POA: Diagnosis not present

## 2023-12-15 DIAGNOSIS — M79659 Pain in unspecified thigh: Secondary | ICD-10-CM | POA: Diagnosis present

## 2023-12-15 DIAGNOSIS — B356 Tinea cruris: Secondary | ICD-10-CM

## 2023-12-15 LAB — CBC WITH DIFFERENTIAL/PLATELET
Abs Immature Granulocytes: 0.03 K/uL (ref 0.00–0.07)
Basophils Absolute: 0 K/uL (ref 0.0–0.1)
Basophils Relative: 0 %
Eosinophils Absolute: 0.2 K/uL (ref 0.0–0.5)
Eosinophils Relative: 2 %
HCT: 40.3 % (ref 39.0–52.0)
Hemoglobin: 13.7 g/dL (ref 13.0–17.0)
Immature Granulocytes: 0 %
Lymphocytes Relative: 19 %
Lymphs Abs: 2 K/uL (ref 0.7–4.0)
MCH: 29.7 pg (ref 26.0–34.0)
MCHC: 34 g/dL (ref 30.0–36.0)
MCV: 87.4 fL (ref 80.0–100.0)
Monocytes Absolute: 1.1 K/uL — ABNORMAL HIGH (ref 0.1–1.0)
Monocytes Relative: 10 %
Neutro Abs: 7.5 K/uL (ref 1.7–7.7)
Neutrophils Relative %: 69 %
Platelets: 289 K/uL (ref 150–400)
RBC: 4.61 MIL/uL (ref 4.22–5.81)
RDW: 12.2 % (ref 11.5–15.5)
WBC: 10.9 K/uL — ABNORMAL HIGH (ref 4.0–10.5)
nRBC: 0 % (ref 0.0–0.2)

## 2023-12-15 LAB — COMPREHENSIVE METABOLIC PANEL WITH GFR
ALT: 11 U/L (ref 0–44)
AST: 21 U/L (ref 15–41)
Albumin: 4.2 g/dL (ref 3.5–5.0)
Alkaline Phosphatase: 97 U/L (ref 38–126)
Anion gap: 12 (ref 5–15)
BUN: 18 mg/dL (ref 8–23)
CO2: 22 mmol/L (ref 22–32)
Calcium: 9.1 mg/dL (ref 8.9–10.3)
Chloride: 105 mmol/L (ref 98–111)
Creatinine, Ser: 1 mg/dL (ref 0.61–1.24)
GFR, Estimated: >60 mL/min (ref >60)
Glucose, Bld: 114 mg/dL — ABNORMAL HIGH (ref 70–99)
Potassium: 4.4 mmol/L (ref 3.5–5.1)
Sodium: 139 mmol/L (ref 135–145)
Total Bilirubin: 0.6 mg/dL (ref 0.0–1.2)
Total Protein: 7.5 g/dL (ref 6.5–8.1)

## 2023-12-15 MED ORDER — KETOCONAZOLE 2 % EX CREA
TOPICAL_CREAM | CUTANEOUS | 0 refills | Status: AC
Start: 2023-12-15 — End: ?

## 2023-12-15 MED ORDER — AMOXICILLIN-POT CLAVULANATE 875-125 MG PO TABS
1.0000 | ORAL_TABLET | Freq: Two times a day (BID) | ORAL | 0 refills | Status: AC
Start: 2023-12-15 — End: ?

## 2023-12-15 MED ORDER — AMOXICILLIN-POT CLAVULANATE 875-125 MG PO TABS
1.0000 | ORAL_TABLET | Freq: Once | ORAL | Status: AC
  Administered 2023-12-15: 1 via ORAL
  Filled 2023-12-15: qty 1

## 2023-12-15 MED ORDER — DOXYCYCLINE HYCLATE 100 MG PO CAPS: 100.0000 mg | ORAL_CAPSULE | Freq: Two times a day (BID) | ORAL | 0 refills | Status: AC

## 2023-12-15 NOTE — ED Provider Notes (Signed)
  [place] EMERGENCY DEPARTMENT AT [facility] Provider Note   CSN: [id number] Arrival date & time: 12/15/23  1655     Patient presents with: Cyst   Abdelrahman AODHAN SCHEIDT is a 68 y.o. male.  {Add pertinent medical, surgical, social history, OB history to HPI:32947} HPI     Prior to Admission medications   Medication Sig Start Date End Date Taking? Authorizing Provider  aspirin EC 81 MG tablet Take 81 mg by mouth daily. 06/05/18   [provider]  lactose free nutrition (BOOST) LIQD Take 237 mLs by mouth daily.    [provider]  metoprolol  succinate (TOPROL -XL) 25 MG 24 hr tablet Take 1 tablet (25 mg total) by mouth daily as needed (breakthrough afib). 03/08/23   Terra Fairy PARAS, PA-C    Allergies: Bee pollen    Review of Systems  Updated Vital Signs BP 130/66   Pulse 70   Temp 98.7 F (37.1 C) (Temporal)   Resp 17   Ht 5' 10 (1.778 m)   Wt 70.3 kg   SpO2 97%   BMI 22.24 kg/m   Physical Exam  (all labs ordered are listed, but only abnormal results are displayed) Labs Reviewed  COMPREHENSIVE METABOLIC PANEL WITH GFR - Abnormal; Notable for the following components:      Result Value   Glucose, Bld 114 (*)    All other components within normal limits  CBC WITH DIFFERENTIAL/PLATELET - Abnormal; Notable for the following components:   WBC 10.9 (*)    Monocytes Absolute 1.1 (*)    All other components within normal limits    EKG: None  Radiology: No results found.  {Document cardiac monitor, telemetry assessment procedure when appropriate:32947} Procedures   Medications Ordered in the ED - No data to display    {Click here for ABCD2, HEART and other calculators REFRESH Note before signing:1}                              Medical Decision Making Amount and/or Complexity of Data Reviewed Labs: ordered.   ***  {Document critical care time when appropriate  Document review of labs and clinical decision tools ie CHADS2VASC2, etc   Document your independent review of radiology images and any outside records  Document your discussion with family members, caretakers and with consultants  Document social determinants of health affecting pt's care  Document your decision making why or why not admission, treatments were needed:32947:::1}   Final diagnoses:  None    ED Discharge Orders     None

## 2023-12-15 NOTE — ED Triage Notes (Signed)
 Pt POV reporting possible infected cyst above tailbone, draining currently, also reporting L hand redness and swelling from dog scratch. Afebrile.

## 2023-12-15 NOTE — Discharge Instructions (Signed)
 ### Home Care Instructions     **Home Care Instructions and Return Precautions**      **1. Pilonidal Cyst (Draining, No Incision/Drainage Required)**      - **Keep the area clean:** Gently wash the gluteal cleft and surrounding skin daily with mild soap and water. Pat dry thoroughly after bathing or showering.[1][2][3]      - **Hair removal:** Remove hair from the gluteal cleft and nearby skin at least once a week using shaving, depilatory cream, or waxing, as tolerated. This helps prevent recurrence and promotes healing.[1][2][4]      - **Avoid friction and pressure:** Minimize prolonged sitting and activities that cause friction in the area. Wear loose, breathable clothing.[3]      - **Monitor for signs of infection:** Watch for increased redness, swelling, warmth, pus, or worsening pain. These may indicate infection and require prompt medical attention.[1][2]      - **No need for surgical intervention:** As long as there is no abscess or severe infection, conservative management is effective for most cases.[1][2][4]      **2. Fungal Intertrigo of the Gluteal Cleft**      - **Apply ketoconazole  cream:** Use as directed, typically once or twice daily to the affected area. Wash hands before and after application.[5][6]      - **Keep skin folds dry:** After bathing, gently dry the area. Consider using a clean, soft towel or a hair dryer on a cool setting. Moisture-wicking textiles or absorbent powders (such as cornstarch) may help reduce moisture and friction.[6][7]      - **Avoid irritants:** Do not use harsh soaps, perfumes, or powders with added fragrances in the area.      - **Wear loose, absorbent clothing:** Cotton underwear and pants are preferred. Avoid synthetic fabrics that trap moisture.[7]      - **Monitor for worsening symptoms:** If redness, pain, or drainage increases, or if the rash spreads, seek medical advice.[5][6]      **3. Infected Dog Claw Puncture Wound (Left  Hand)**      - **Continue amoxicillin -clavulanate as prescribed:** Take the full course, usually 5-7 days, unless otherwise directed. Do not skip doses.[8][9]      - **Wound care:** Wash the wound gently with soap and water daily. Keep it covered with a clean, dry bandage. Change the bandage at least once daily or if it becomes wet or dirty.[9]      - **Elevate the hand:** When possible, keep the hand elevated to reduce swelling.[9]      - **Monitor for signs of worsening infection:** Look for increased redness, swelling, warmth, pus, streaks up the arm, fever, or loss of function. If any of these occur, contact your healthcare provider promptly.[8][9]      - **Tetanus and rabies:** Ensure tetanus vaccination is up to date. Rabies prophylaxis is not usually needed for healthy, vaccinated dogs, but follow medical advice.[8]      **General Return Precautions**      Seek medical attention immediately if you experience any of the following:      - Fever over 101F (38.3C)      - Spreading redness or swelling around any wound or rash      - Increasing pain not controlled by over-the-counter medication      - Pus or foul-smelling drainage      - Difficulty moving the hand or fingers      - New or worsening rash, especially if associated with pain or drainage      - Signs of  allergic reaction (rash, swelling of lips/tongue, difficulty breathing) after starting any medication      **Follow-up**      - Schedule follow-up as directed by your healthcare provider, typically within 1-2 weeks for wound and skin checks.      - If symptoms worsen or do not improve after 3-5 days of treatment, contact your provider.      **Summary**      These instructions are based on current medical guidelines and expert consensus for the management of pilonidal disease, intertrigo, and animal bite wounds.[1][2][5][6][8][9] Adhering to these recommendations can help promote healing and reduce the risk of  complications.      ### References  1. Management of Pilonidal Disease: A Review. Tory MAYERS, Deans KJ, [place] PC. JAMA Surgery. 2023;158(8):875-883. doi:10.1001/jamasurg.2023.0373. 2. The American Society of Colon and Rectal Surgeons' Clinical Practice Guidelines for the Management of Pilonidal Disease. Vicci FRAME, Vogel JD, Cowan ML, Feingold DL, [employer]. Diseases of the Colon and Rectum. 2019;62(2):146-157. doi:10.1097/DCR.0000000000001237. 3. Pilonidal Sinus Disease: 10 Steps to Optimize Care. Arloa BROCKS, Sibbald RG, Mufti A, Somayaji R. Advances in Skin & Wound Care. 2016;29(10):469-78. doi:10.1097/01.[id number]. 4. Resolution of Mild Pilonidal Disease in Adolescents Without Resection. Check NM, Wynne NK, Mooney DP. Journal of the [employer]. 2022;235(5):773-776. doi:10.1097/XCS.0000000000000356. 5. Intertrigo and Secondary Skin Infections. Kalra MG, Higgins KE, Kinney BS. American Family Physician. 2014;89(7):569-73. 6. The Diagnosis, Management and Prevention of Intertrigo in Adults: A Review. Romanelli M, Voegeli D, Colboc H, et al. Journal of Wound Care. 2023;32(7):411-420. doi:10.12968/jowc.[email or web address]. 7. Intertrigo and Common Secondary Skin Infections. Janniger CK, Schwartz RA, Szepietowski JC, Reich A. American Family Physician. 2005;72(5):833-8. 8. Child Health Update. Management of Dog Bites in Children. Daved LULLA Fear RD. [place] Family Physician Medecin Everitt Faden [place]. 2012;58(10):1094-6, e548-50. 9. [place] Society Practice Guidelines for Basic Wound Management in the [employer]. Richarda RH, Alben I, Vicci BRAVO, et al. [employer] Medicine. 2014;25(3):295-310. doi:10.1016/j.wem.2014.04.005.
